# Patient Record
Sex: Female | Born: 1945 | Race: White | Hispanic: No | Marital: Married | Smoking: Never smoker
Health system: Southern US, Community
[De-identification: ages and names within clinical notes are randomized; demographics above are authoritative.]

## PROBLEM LIST (undated history)

## (undated) DIAGNOSIS — M1990 Unspecified osteoarthritis, unspecified site: Secondary | ICD-10-CM

## (undated) DIAGNOSIS — C4492 Squamous cell carcinoma of skin, unspecified: Secondary | ICD-10-CM

## (undated) HISTORY — DX: Unspecified osteoarthritis, unspecified site: M19.90

## (undated) HISTORY — DX: Squamous cell carcinoma of skin, unspecified: C44.92

---

## 2004-07-04 ENCOUNTER — Ambulatory Visit: Payer: Self-pay | Admitting: Internal Medicine

## 2006-07-09 ENCOUNTER — Ambulatory Visit: Payer: Self-pay | Admitting: Internal Medicine

## 2007-10-07 ENCOUNTER — Ambulatory Visit: Payer: Self-pay | Admitting: Internal Medicine

## 2009-10-11 ENCOUNTER — Ambulatory Visit: Payer: Self-pay | Admitting: Internal Medicine

## 2010-11-21 ENCOUNTER — Ambulatory Visit: Payer: Self-pay | Admitting: Internal Medicine

## 2011-12-04 ENCOUNTER — Ambulatory Visit: Payer: Self-pay

## 2012-01-01 ENCOUNTER — Ambulatory Visit: Payer: Self-pay | Admitting: Gastroenterology

## 2012-12-16 ENCOUNTER — Ambulatory Visit: Payer: Self-pay

## 2014-04-20 ENCOUNTER — Ambulatory Visit: Payer: Self-pay | Admitting: Internal Medicine

## 2014-10-01 DIAGNOSIS — R7989 Other specified abnormal findings of blood chemistry: Secondary | ICD-10-CM | POA: Insufficient documentation

## 2015-04-05 DIAGNOSIS — E7800 Pure hypercholesterolemia, unspecified: Secondary | ICD-10-CM | POA: Insufficient documentation

## 2016-10-10 ENCOUNTER — Other Ambulatory Visit: Payer: Self-pay | Admitting: Internal Medicine

## 2016-10-10 DIAGNOSIS — Z1231 Encounter for screening mammogram for malignant neoplasm of breast: Secondary | ICD-10-CM

## 2016-10-24 ENCOUNTER — Ambulatory Visit
Admission: RE | Admit: 2016-10-24 | Discharge: 2016-10-24 | Disposition: A | Discharge status: Home / Self Care | Payer: Medicare Other | Source: Ambulatory Visit | Attending: Internal Medicine | Admitting: Internal Medicine

## 2016-10-24 DIAGNOSIS — Z1231 Encounter for screening mammogram for malignant neoplasm of breast: Secondary | ICD-10-CM | POA: Diagnosis not present

## 2018-10-28 ENCOUNTER — Other Ambulatory Visit: Payer: Self-pay | Admitting: Internal Medicine

## 2018-10-28 DIAGNOSIS — Z1231 Encounter for screening mammogram for malignant neoplasm of breast: Secondary | ICD-10-CM

## 2019-01-01 ENCOUNTER — Other Ambulatory Visit: Payer: Self-pay

## 2019-01-01 ENCOUNTER — Ambulatory Visit
Admission: RE | Admit: 2019-01-01 | Discharge: 2019-01-01 | Disposition: A | Discharge status: Home / Self Care | Payer: Medicare Other | Source: Ambulatory Visit | Attending: Internal Medicine | Admitting: Internal Medicine

## 2019-01-01 DIAGNOSIS — Z1231 Encounter for screening mammogram for malignant neoplasm of breast: Secondary | ICD-10-CM | POA: Insufficient documentation

## 2019-11-10 DIAGNOSIS — Z8616 Personal history of COVID-19: Secondary | ICD-10-CM | POA: Insufficient documentation

## 2019-12-29 ENCOUNTER — Other Ambulatory Visit: Payer: Self-pay

## 2019-12-29 ENCOUNTER — Ambulatory Visit: Payer: Medicare Other | Admitting: Dermatology

## 2019-12-29 DIAGNOSIS — L820 Inflamed seborrheic keratosis: Secondary | ICD-10-CM

## 2019-12-29 DIAGNOSIS — L821 Other seborrheic keratosis: Secondary | ICD-10-CM | POA: Diagnosis not present

## 2019-12-29 DIAGNOSIS — L578 Other skin changes due to chronic exposure to nonionizing radiation: Secondary | ICD-10-CM | POA: Diagnosis not present

## 2019-12-29 NOTE — Progress Notes (Signed)
   New Patient Visit  Subjective  Yvette Baker is a 74 y.o. female who presents for the following: Spot Check (Pt has a spot on her left cheek x approx 1.5 years, irritating, spot gets crusty and hard. ). She has other areas to be evaluated too.  Objective  Well appearing patient in no apparent distress; mood and affect are within normal limits.  A focused examination was performed including face. Relevant physical exam findings are noted in the Assessment and Plan.  Objective  Left cheek: Erythematous keratotic or waxy stuck-on papule or plaque.   Assessment & Plan  Inflamed seborrheic keratosis Left cheek  Follow up in 2 months if needed - If persistent.  Destruction of lesion - Left cheek Complexity: simple   Destruction method: cryotherapy   Informed consent: discussed and consent obtained   Timeout:  patient name, date of birth, surgical site, and procedure verified Lesion destroyed using liquid nitrogen: Yes   Region frozen until ice ball extended beyond lesion: Yes   Outcome: patient tolerated procedure well with no complications   Post-procedure details: wound care instructions given     Seborrheic Keratoses - Stuck-on, waxy, tan-brown papules and plaques  - Discussed benign etiology and prognosis. - Observe - Call for any changes  Actinic Damage - diffuse scaly erythematous macules with underlying dyspigmentation - Recommend daily broad spectrum sunscreen SPF 30+ to sun-exposed areas, reapply every 2 hours as needed.  - Call for new or changing lesions.   Return in about 2 months (around 02/28/2020) for ISK f/u.   IEpifania Gore, CMA, am acting as scribe for Armida Sans, MD.  Documentation: I have reviewed the above documentation for accuracy and completeness, and I agree with the above.  Armida Sans, MD

## 2019-12-29 NOTE — Patient Instructions (Signed)

## 2019-12-30 ENCOUNTER — Encounter: Payer: Self-pay | Admitting: Dermatology

## 2020-03-15 ENCOUNTER — Ambulatory Visit: Payer: Medicare Other | Admitting: Dermatology

## 2020-05-10 ENCOUNTER — Other Ambulatory Visit: Payer: Self-pay | Admitting: Internal Medicine

## 2020-05-10 DIAGNOSIS — Z1231 Encounter for screening mammogram for malignant neoplasm of breast: Secondary | ICD-10-CM

## 2020-05-31 ENCOUNTER — Ambulatory Visit: Payer: Medicare Other | Admitting: Dermatology

## 2020-05-31 ENCOUNTER — Other Ambulatory Visit: Payer: Self-pay

## 2020-05-31 ENCOUNTER — Encounter: Payer: Self-pay | Admitting: Dermatology

## 2020-05-31 DIAGNOSIS — L820 Inflamed seborrheic keratosis: Secondary | ICD-10-CM

## 2020-05-31 DIAGNOSIS — L821 Other seborrheic keratosis: Secondary | ICD-10-CM | POA: Diagnosis not present

## 2020-05-31 DIAGNOSIS — L578 Other skin changes due to chronic exposure to nonionizing radiation: Secondary | ICD-10-CM | POA: Diagnosis not present

## 2020-05-31 NOTE — Patient Instructions (Signed)

## 2020-05-31 NOTE — Progress Notes (Signed)
   Follow-Up Visit   Subjective  Yvette Baker is a 75 y.o. female who presents for the following: irritated seborrheic keratosis (Of the L cheek - persistent, grew back after being tx with LN2).  The following portions of the chart were reviewed this encounter and updated as appropriate:   Allergies  Meds  Problems  Med Hx  Surg Hx  Fam Hx     Review of Systems:  No other skin or systemic complaints except as noted in HPI or Assessment and Plan.  Objective  Well appearing patient in no apparent distress; mood and affect are within normal limits.  A focused examination was performed including the face. Relevant physical exam findings are noted in the Assessment and Plan.  Objective  L cheek x 1: Erythematous keratotic or waxy stuck-on papule or plaque.   Assessment & Plan  Inflamed seborrheic keratosis L cheek x 1  Destruction of lesion - L cheek x 1 Complexity: simple   Destruction method: cryotherapy   Informed consent: discussed and consent obtained   Timeout:  patient name, date of birth, surgical site, and procedure verified Lesion destroyed using liquid nitrogen: Yes   Region frozen until ice ball extended beyond lesion: Yes   Outcome: patient tolerated procedure well with no complications   Post-procedure details: wound care instructions given     Seborrheic Keratoses - Stuck-on, waxy, tan-brown papules and/or plaques  - Benign-appearing - Discussed benign etiology and prognosis. - Observe - Call for any changes  Actinic Damage - chronic, secondary to cumulative UV radiation exposure/sun exposure over time - diffuse scaly erythematous macules with underlying dyspigmentation - Recommend daily broad spectrum sunscreen SPF 30+ to sun-exposed areas, reapply every 2 hours as needed.  - Recommend staying in the shade or wearing long sleeves, sun glasses (UVA+UVB protection) and wide brim hats (4-inch brim around the entire circumference of the hat). - Call for  new or changing lesions.  Return if symptoms worsen or fail to improve.  Maylene Roes, CMA, am acting as scribe for Armida Sans, MD .  Documentation: I have reviewed the above documentation for accuracy and completeness, and I agree with the above.  Armida Sans, MD

## 2021-05-16 ENCOUNTER — Other Ambulatory Visit: Payer: Self-pay | Admitting: Internal Medicine

## 2021-05-16 DIAGNOSIS — Z1231 Encounter for screening mammogram for malignant neoplasm of breast: Secondary | ICD-10-CM

## 2021-06-27 ENCOUNTER — Ambulatory Visit: Payer: Medicare Other | Admitting: Dermatology

## 2021-06-27 DIAGNOSIS — L821 Other seborrheic keratosis: Secondary | ICD-10-CM

## 2021-06-27 DIAGNOSIS — L570 Actinic keratosis: Secondary | ICD-10-CM | POA: Diagnosis not present

## 2021-06-27 DIAGNOSIS — L820 Inflamed seborrheic keratosis: Secondary | ICD-10-CM

## 2021-06-27 DIAGNOSIS — D692 Other nonthrombocytopenic purpura: Secondary | ICD-10-CM

## 2021-06-27 DIAGNOSIS — L578 Other skin changes due to chronic exposure to nonionizing radiation: Secondary | ICD-10-CM | POA: Diagnosis not present

## 2021-06-27 NOTE — Progress Notes (Signed)
? ?Follow-Up Visit ?  ?Subjective  ?Yvette Baker is a 76 y.o. female who presents for the following: Follow-up (The patient has spots, moles and lesions to be evaluated, some may be new or changing and the patient has concerns that these could be cancer. ). ? ?The following portions of the chart were reviewed this encounter and updated as appropriate:  ? Allergies  Meds  Problems  Med Hx  Surg Hx  Fam Hx   ?  ? ?Review of Systems:  No other skin or systemic complaints except as noted in HPI or Assessment and Plan. ? ?Objective  ?Well appearing patient in no apparent distress; mood and affect are within normal limits. ? ?A focused examination was performed including face,arm,chest. Relevant physical exam findings are noted in the Assessment and Plan. ? ?left cheek x 1, chest x 1  (2) (2) ?Stuck-on, waxy, tan-brown papules--Discussed benign etiology and prognosis.  ? ?left brow x 1, nose x 1  (2) (2) ?Erythematous thin papules/macules with gritty scale.  ? ? ?Assessment & Plan  ?Inflamed seborrheic keratosis (2) ?left cheek x 1, chest x 1  (2) ? ?Destruction of lesion - left cheek x 1, chest x 1  (2) ?Complexity: simple   ?Destruction method: cryotherapy   ?Informed consent: discussed and consent obtained   ?Timeout:  patient name, date of birth, surgical site, and procedure verified ?Lesion destroyed using liquid nitrogen: Yes   ?Region frozen until ice ball extended beyond lesion: Yes   ?Outcome: patient tolerated procedure well with no complications   ?Post-procedure details: wound care instructions given   ? ?AK (actinic keratosis) (2) ?left brow x 1, nose x 1  (2) ? ?Actinic keratoses are precancerous spots that appear secondary to cumulative UV radiation exposure/sun exposure over time. They are chronic with expected duration over 1 year. A portion of actinic keratoses will progress to squamous cell carcinoma of the skin. It is not possible to reliably predict which spots will progress to skin cancer  and so treatment is recommended to prevent development of skin cancer. ? ?Recommend daily broad spectrum sunscreen SPF 30+ to sun-exposed areas, reapply every 2 hours as needed.  ?Recommend staying in the shade or wearing long sleeves, sun glasses (UVA+UVB protection) and wide brim hats (4-inch brim around the entire circumference of the hat). ?Call for new or changing lesions.  ? ?Destruction of lesion - left brow x 1, nose x 1  (2) ?Complexity: simple   ?Destruction method: cryotherapy   ?Informed consent: discussed and consent obtained   ?Timeout:  patient name, date of birth, surgical site, and procedure verified ?Lesion destroyed using liquid nitrogen: Yes   ?Region frozen until ice ball extended beyond lesion: Yes   ?Outcome: patient tolerated procedure well with no complications   ?Post-procedure details: wound care instructions given   ? ?Purpura - Chronic; persistent and recurrent.  Treatable, but not curable. ?- Violaceous macules and patches ?- Benign ?- Related to trauma, age, sun damage and/or use of blood thinners, chronic use of topical and/or oral steroids ?- Observe ?- Can use OTC arnica containing moisturizer such as Dermend Bruise Formula if desired ?- Call for worsening or other concerns  ? ?Seborrheic Keratoses ?- Stuck-on, waxy, tan-brown papules and/or plaques  ?- Benign-appearing ?- Discussed benign etiology and prognosis. ?- Observe ?- Call for any changes ? ?Actinic Damage ?- chronic, secondary to cumulative UV radiation exposure/sun exposure over time ?- diffuse scaly erythematous macules with underlying dyspigmentation ?- Recommend daily  broad spectrum sunscreen SPF 30+ to sun-exposed areas, reapply every 2 hours as needed.  ?- Recommend staying in the shade or wearing long sleeves, sun glasses (UVA+UVB protection) and wide brim hats (4-inch brim around the entire circumference of the hat). ?- Call for new or changing lesions.  ? ?Return in about 1 year (around 06/28/2022) for AKs,  ISK. ? ?I, Marye Round, CMA, am acting as scribe for Sarina Ser, MD .  ?Documentation: I have reviewed the above documentation for accuracy and completeness, and I agree with the above. ? ?Sarina Ser, MD ? ?

## 2021-06-27 NOTE — Patient Instructions (Addendum)

## 2021-07-04 ENCOUNTER — Ambulatory Visit
Admission: RE | Admit: 2021-07-04 | Discharge: 2021-07-04 | Disposition: A | Discharge status: Home / Self Care | Payer: Medicare Other | Source: Ambulatory Visit | Attending: Internal Medicine | Admitting: Internal Medicine

## 2021-07-04 DIAGNOSIS — Z1231 Encounter for screening mammogram for malignant neoplasm of breast: Secondary | ICD-10-CM | POA: Diagnosis present

## 2021-07-05 ENCOUNTER — Encounter: Payer: Self-pay | Admitting: Dermatology

## 2022-07-05 ENCOUNTER — Ambulatory Visit: Payer: Medicare Other | Admitting: Dermatology

## 2022-10-18 ENCOUNTER — Ambulatory Visit: Payer: Medicare Other | Admitting: Dermatology

## 2022-10-18 VITALS — BP 137/74 | HR 85

## 2022-10-18 DIAGNOSIS — L570 Actinic keratosis: Secondary | ICD-10-CM

## 2022-10-18 DIAGNOSIS — L578 Other skin changes due to chronic exposure to nonionizing radiation: Secondary | ICD-10-CM

## 2022-10-18 DIAGNOSIS — L820 Inflamed seborrheic keratosis: Secondary | ICD-10-CM

## 2022-10-18 DIAGNOSIS — L821 Other seborrheic keratosis: Secondary | ICD-10-CM | POA: Diagnosis not present

## 2022-10-18 DIAGNOSIS — L814 Other melanin hyperpigmentation: Secondary | ICD-10-CM

## 2022-10-18 DIAGNOSIS — W908XXA Exposure to other nonionizing radiation, initial encounter: Secondary | ICD-10-CM | POA: Diagnosis not present

## 2022-10-18 NOTE — Progress Notes (Signed)
Follow-Up Visit   Subjective  Yvette Baker is a 77 y.o. female who presents for the following: Irregular crusty skin lesion on the L brow x 4-5 mths.  The patient has spots, moles and lesions to be evaluated, some may be new or changing and the patient may have concern these could be cancer.   The following portions of the chart were reviewed this encounter and updated as appropriate: medications, allergies, medical history  Review of Systems:  No other skin or systemic complaints except as noted in HPI or Assessment and Plan.  Objective  Well appearing patient in no apparent distress; mood and affect are within normal limits.   A focused examination was performed of the following areas:   Relevant exam findings are noted in the Assessment and Plan.  L brow x 1, chest x 2 (3) Erythematous thin papules/macules with gritty scale.      R cheek x 1 Erythematous stuck-on, waxy papule or plaque    Assessment & Plan   SEBORRHEIC KERATOSIS - Stuck-on, waxy, tan-brown papules and/or plaques  - Benign-appearing - Discussed benign etiology and prognosis. - Observe - Call for any changes  LENTIGINES Exam: scattered tan macules Due to sun exposure Treatment Plan: Benign-appearing, observe. Recommend daily broad spectrum sunscreen SPF 30+ to sun-exposed areas, reapply every 2 hours as needed.  Call for any changes  ACTINIC DAMAGE - chronic, secondary to cumulative UV radiation exposure/sun exposure over time - diffuse scaly erythematous macules with underlying dyspigmentation - Recommend daily broad spectrum sunscreen SPF 30+ to sun-exposed areas, reapply every 2 hours as needed.  - Recommend staying in the shade or wearing long sleeves, sun glasses (UVA+UVB protection) and wide brim hats (4-inch brim around the entire circumference of the hat). - Call for new or changing lesions.  AK (actinic keratosis) (3) L brow x 1, chest x 2  Actinic keratoses are precancerous  spots that appear secondary to cumulative UV radiation exposure/sun exposure over time. They are chronic with expected duration over 1 year. A portion of actinic keratoses will progress to squamous cell carcinoma of the skin. It is not possible to reliably predict which spots will progress to skin cancer and so treatment is recommended to prevent development of skin cancer.  Recommend daily broad spectrum sunscreen SPF 30+ to sun-exposed areas, reapply every 2 hours as needed.  Recommend staying in the shade or wearing long sleeves, sun glasses (UVA+UVB protection) and wide brim hats (4-inch brim around the entire circumference of the hat). Call for new or changing lesions.   Destruction of lesion - L brow x 1, chest x 2 (3) Complexity: simple   Destruction method: cryotherapy   Informed consent: discussed and consent obtained   Timeout:  patient name, date of birth, surgical site, and procedure verified Lesion destroyed using liquid nitrogen: Yes   Region frozen until ice ball extended beyond lesion: Yes   Outcome: patient tolerated procedure well with no complications   Post-procedure details: wound care instructions given    Inflamed seborrheic keratosis R cheek x 1  Symptomatic, irritating, patient would like treated.   Destruction of lesion - R cheek x 1 Complexity: simple   Destruction method: cryotherapy   Informed consent: discussed and consent obtained   Timeout:  patient name, date of birth, surgical site, and procedure verified Lesion destroyed using liquid nitrogen: Yes   Region frozen until ice ball extended beyond lesion: Yes   Outcome: patient tolerated procedure well with no complications  Post-procedure details: wound care instructions given     Return for AK follow up in 3-5 mths.  Maylene Roes, CMA, am acting as scribe for Armida Sans, MD .   Documentation: I have reviewed the above documentation for accuracy and completeness, and I agree with the  above.  Armida Sans, MD

## 2022-10-18 NOTE — Patient Instructions (Addendum)

## 2022-10-26 ENCOUNTER — Encounter: Payer: Self-pay | Admitting: Dermatology

## 2022-11-27 ENCOUNTER — Other Ambulatory Visit: Payer: Self-pay | Admitting: Internal Medicine

## 2022-11-27 DIAGNOSIS — Z1231 Encounter for screening mammogram for malignant neoplasm of breast: Secondary | ICD-10-CM

## 2022-12-05 LAB — HM DEXA SCAN

## 2023-01-09 ENCOUNTER — Encounter: Payer: Self-pay | Admitting: Family Medicine

## 2023-01-09 ENCOUNTER — Ambulatory Visit (INDEPENDENT_AMBULATORY_CARE_PROVIDER_SITE_OTHER): Payer: Medicare Other | Admitting: Family Medicine

## 2023-01-09 VITALS — BP 134/68 | HR 82 | Temp 98.0°F | Resp 16 | Ht 60.0 in | Wt 145.1 lb

## 2023-01-09 DIAGNOSIS — I10 Essential (primary) hypertension: Secondary | ICD-10-CM

## 2023-01-09 DIAGNOSIS — E559 Vitamin D deficiency, unspecified: Secondary | ICD-10-CM | POA: Diagnosis not present

## 2023-01-09 DIAGNOSIS — K219 Gastro-esophageal reflux disease without esophagitis: Secondary | ICD-10-CM

## 2023-01-09 DIAGNOSIS — M810 Age-related osteoporosis without current pathological fracture: Secondary | ICD-10-CM | POA: Diagnosis not present

## 2023-01-09 DIAGNOSIS — E785 Hyperlipidemia, unspecified: Secondary | ICD-10-CM

## 2023-01-09 DIAGNOSIS — Z23 Encounter for immunization: Secondary | ICD-10-CM | POA: Diagnosis not present

## 2023-01-09 DIAGNOSIS — J309 Allergic rhinitis, unspecified: Secondary | ICD-10-CM

## 2023-01-09 DIAGNOSIS — Z1231 Encounter for screening mammogram for malignant neoplasm of breast: Secondary | ICD-10-CM

## 2023-01-09 NOTE — Progress Notes (Signed)
SUBJECTIVE:   Chief Complaint  Patient presents with   Establish Care   HPI Presents to clinic to establish care  Discussed the use of AI scribe software for clinical note transcription with the patient, who gave verbal consent to proceed.  History of Present Illness Yvette Baker, a 77 year old patient with a history of vitamin D deficiency, high cholesterol, and bone density issues, presents for establishing care. She was previously under the care of [facility]. Her most recent blood work was done in September of the current year, which included checks for vitamin D levels and cholesterol. She reports that her bad cholesterol levels are good, attributing this to an active lifestyle and work routine.  She has been on vitamin D supplements, initially once a week, now every two weeks, as per her previous doctor's instructions. She has also recently started on Fosamax (70mg  once a week) following a bone density test. She reports having taken a month's worth of Fosamax already.  She was previously on Zetia for cholesterol management but stopped taking it about six to eight months ago due to leg pain that would wake her up at night. She also uses Flonase occasionally for nasal issues and takes hydrochlorothiazide (12.5mg ) for blood pressure management. She takes loratadine daily for allergies, which she reports has helped prevent sinus infections. She has been on omeprazole for a long time for stomach issues.  She reports no issues with hearing, and no problems with bowel movements or urination. She denies any chest pain, shortness of breath, or leg swelling. She has had normal mammograms in the past, with the most recent one being last year. She has also had a normal colonoscopy and does not require another one. Her last Pap smear was when she was 41 or younger, and it was normal. She has received the first shingles shot, the flu shot, and the pneumonia vaccine. She has not had a tetanus  shot.  She reports having had walking pneumonia during the last part of the COVID-19 pandemic. She also mentions an episode of dizziness that led to a fall and subsequent bleeding from the head. She was taken to the hospital and everything was found to be normal. She has been living in the [place] area since the 1970s and leads an active lifestyle.    PERTINENT PMH / PSH: As above  OBJECTIVE:  BP 134/68   Pulse 82   Temp 98 F (36.7 C)   Resp 16   Ht 5' (1.524 m)   Wt 145 lb 2 oz (65.8 kg)   SpO2 97%   BMI 28.34 kg/m    Physical Exam Vitals reviewed.  Constitutional:      General: She is not in acute distress.    Appearance: She is not ill-appearing.  HENT:     Head: Normocephalic.     Right Ear: Tympanic membrane, ear canal and external ear normal.     Left Ear: Tympanic membrane, ear canal and external ear normal.     Nose: Nose normal.     Mouth/Throat:     Mouth: Mucous membranes are moist.  Eyes:     Extraocular Movements: Extraocular movements intact.     Conjunctiva/sclera: Conjunctivae normal.     Pupils: Pupils are equal, round, and reactive to light.  Neck:     Thyroid: No thyromegaly or thyroid tenderness.     Vascular: No carotid bruit.  Cardiovascular:     Rate and Rhythm: Normal rate and regular rhythm.  Pulses: Normal pulses.     Heart sounds: Normal heart sounds.  Pulmonary:     Effort: Pulmonary effort is normal.     Breath sounds: Normal breath sounds.  Abdominal:     General: Bowel sounds are normal. There is no distension.     Palpations: Abdomen is soft.     Tenderness: There is no abdominal tenderness. There is no right CVA tenderness, left CVA tenderness, guarding or rebound.  Musculoskeletal:        General: Normal range of motion.     Cervical back: Normal range of motion.     Right lower leg: No edema.     Left lower leg: No edema.  Lymphadenopathy:     Cervical: No cervical adenopathy.  Skin:    Capillary Refill: Capillary  refill takes less than 2 seconds.  Neurological:     General: No focal deficit present.     Mental Status: She is alert and oriented to person, place, and time. Mental status is at baseline.     Motor: No weakness.  Psychiatric:        Mood and Affect: Mood normal.        Behavior: Behavior normal.        Thought Content: Thought content normal.        Judgment: Judgment normal.        01/09/2023   10:38 AM  Depression screen PHQ 2/9  Decreased Interest 0  Down, Depressed, Hopeless 0  PHQ - 2 Score 0  Altered sleeping 0  Tired, decreased energy 0  Change in appetite 0  Feeling bad or failure about yourself  0  Trouble concentrating 0  Moving slowly or fidgety/restless 0  Suicidal thoughts 0  PHQ-9 Score 0  Difficult doing work/chores Not difficult at all      01/09/2023   10:38 AM  GAD 7 : Generalized Anxiety Score  Nervous, Anxious, on Edge 0  Control/stop worrying 0  Worry too much - different things 0  Trouble relaxing 0  Restless 0  Easily annoyed or irritable 0  Afraid - awful might happen 0  Total GAD 7 Score 0  Anxiety Difficulty Not difficult at all    ASSESSMENT/PLAN:  Primary hypertension Assessment & Plan: Controlled on hydrochlorothiazide 12.5mg  daily. -Continue hydrochlorothiazide 12.5mg  daily.   Need for influenza vaccination -     Flu Vaccine Trivalent High Dose (Fluad)  Osteoporosis, unspecified osteoporosis type, unspecified pathological fracture presence Assessment & Plan: Recently started on Fosamax 70mg  weekly. No reported side effects. -Continue Fosamax 70mg  weekly.   Vitamin D deficiency Assessment & Plan: On Vitamin D supplementation, frequency recently reduced to every two weeks. -Continue Vitamin D supplementation as prescribed.   Hyperlipidemia, unspecified hyperlipidemia type Assessment & Plan: Self discontinued Zetia due to muscle pain approximately 6-8 months ago. Reports good lifestyle habits including physical  activity. -No current plan for medication. Encourage continuation of healthy lifestyle habits. -Review recent labs from former PCP   Allergic rhinitis, unspecified seasonality, unspecified trigger Assessment & Plan: Reports daily use of loratadine and occasional use of Flonase. -Continue loratadine and Flonase as needed.   Gastroesophageal reflux disease without esophagitis Assessment & Plan: Long-term use of omeprazole. -Continue omeprazole daily.   Breast cancer screening by mammogram -     3D Screening Mammogram, Left and Right; Future    General Health Maintenance / Followup Plans -Review recent labs from September 2024. -Follow up in 6 months for HTN, HLD -Dexa  completed.  HIP L.FEM. NECK 12/05/22 0.563 -2.6    PDMP reviewed  Return if symptoms worsen or fail to improve, for PCP.  Dana Allan, MD

## 2023-01-09 NOTE — Patient Instructions (Addendum)
It was a pleasure meeting you today. Thank you for allowing me to take part in your health care.  Our goals for today as we discussed include:  Will get labs at next visit  Will review records    This is a list of the screening recommended for you and due dates:  Health Maintenance  Topic Date Due   Medicare Annual Wellness Visit  Never done   COVID-19 Vaccine (1) Never done   Hepatitis C Screening  Never done   DTaP/Tdap/Td vaccine (1 - Tdap) Never done   Zoster (Shingles) Vaccine (1 of 2) 01/07/1965   DEXA scan (bone density measurement)  Never done   Pneumonia Vaccine  Completed   Flu Shot  Completed   HPV Vaccine  Aged Out    Follow up as needed  If you have any questions or concerns, please do not hesitate to call the office at [phone].  I look forward to our next visit and until then take care and stay safe.  Regards,   Dana Allan, MD   [facility]

## 2023-01-15 ENCOUNTER — Encounter: Payer: Self-pay | Admitting: Family Medicine

## 2023-01-15 ENCOUNTER — Telehealth: Payer: Self-pay | Admitting: Family Medicine

## 2023-01-15 ENCOUNTER — Telehealth: Payer: Self-pay

## 2023-01-15 DIAGNOSIS — I10 Essential (primary) hypertension: Secondary | ICD-10-CM | POA: Insufficient documentation

## 2023-01-15 DIAGNOSIS — J309 Allergic rhinitis, unspecified: Secondary | ICD-10-CM | POA: Insufficient documentation

## 2023-01-15 DIAGNOSIS — Z1231 Encounter for screening mammogram for malignant neoplasm of breast: Secondary | ICD-10-CM | POA: Insufficient documentation

## 2023-01-15 DIAGNOSIS — M810 Age-related osteoporosis without current pathological fracture: Secondary | ICD-10-CM | POA: Insufficient documentation

## 2023-01-15 DIAGNOSIS — E559 Vitamin D deficiency, unspecified: Secondary | ICD-10-CM | POA: Insufficient documentation

## 2023-01-15 DIAGNOSIS — K219 Gastro-esophageal reflux disease without esophagitis: Secondary | ICD-10-CM | POA: Insufficient documentation

## 2023-01-15 DIAGNOSIS — E785 Hyperlipidemia, unspecified: Secondary | ICD-10-CM | POA: Insufficient documentation

## 2023-01-15 DIAGNOSIS — Z23 Encounter for immunization: Secondary | ICD-10-CM | POA: Insufficient documentation

## 2023-01-15 NOTE — Assessment & Plan Note (Signed)
Self discontinued Zetia due to muscle pain approximately 6-8 months ago. Reports good lifestyle habits including physical activity. -No current plan for medication. Encourage continuation of healthy lifestyle habits. -Review recent labs from former PCP

## 2023-01-15 NOTE — Assessment & Plan Note (Signed)
On Vitamin D supplementation, frequency recently reduced to every two weeks. -Continue Vitamin D supplementation as prescribed.

## 2023-01-15 NOTE — Telephone Encounter (Signed)
Error

## 2023-01-15 NOTE — Assessment & Plan Note (Signed)
Reports daily use of loratadine and occasional use of Flonase. -Continue loratadine and Flonase as needed.

## 2023-01-15 NOTE — Telephone Encounter (Signed)
er

## 2023-01-15 NOTE — Assessment & Plan Note (Signed)
Long-term use of omeprazole. -Continue omeprazole daily.

## 2023-01-15 NOTE — Assessment & Plan Note (Signed)
Controlled on hydrochlorothiazide 12.5mg  daily. -Continue hydrochlorothiazide 12.5mg  daily.

## 2023-01-15 NOTE — Assessment & Plan Note (Signed)
Recently started on Fosamax 70mg  weekly. No reported side effects. -Continue Fosamax 70mg  weekly.

## 2023-03-05 ENCOUNTER — Encounter: Payer: Self-pay | Admitting: Dermatology

## 2023-03-05 ENCOUNTER — Ambulatory Visit: Payer: Medicare Other | Admitting: Dermatology

## 2023-03-05 DIAGNOSIS — D492 Neoplasm of unspecified behavior of bone, soft tissue, and skin: Secondary | ICD-10-CM | POA: Diagnosis not present

## 2023-03-05 DIAGNOSIS — L820 Inflamed seborrheic keratosis: Secondary | ICD-10-CM | POA: Diagnosis not present

## 2023-03-05 DIAGNOSIS — W908XXA Exposure to other nonionizing radiation, initial encounter: Secondary | ICD-10-CM | POA: Diagnosis not present

## 2023-03-05 DIAGNOSIS — D099 Carcinoma in situ, unspecified: Secondary | ICD-10-CM

## 2023-03-05 DIAGNOSIS — D485 Neoplasm of uncertain behavior of skin: Secondary | ICD-10-CM

## 2023-03-05 DIAGNOSIS — L578 Other skin changes due to chronic exposure to nonionizing radiation: Secondary | ICD-10-CM | POA: Diagnosis not present

## 2023-03-05 DIAGNOSIS — C44320 Squamous cell carcinoma of skin of unspecified parts of face: Secondary | ICD-10-CM | POA: Diagnosis not present

## 2023-03-05 DIAGNOSIS — C44329 Squamous cell carcinoma of skin of other parts of face: Secondary | ICD-10-CM

## 2023-03-05 DIAGNOSIS — L570 Actinic keratosis: Secondary | ICD-10-CM | POA: Diagnosis not present

## 2023-03-05 HISTORY — DX: Carcinoma in situ, unspecified: D09.9

## 2023-03-05 NOTE — Progress Notes (Signed)
 Follow-Up Visit   Subjective  Yvette Baker is a 78 y.o. female who presents for the following: 4 month AK follow up. Patient advises AK treated at left brow came back, spots at chest that were treated resolved.   No hx skin cancer.   The patient has spots, moles and lesions to be evaluated, some may be new or changing and the patient may have concern these could be cancer.   The following portions of the chart were reviewed this encounter and updated as appropriate: medications, allergies, medical history  Review of Systems:  No other skin or systemic complaints except as noted in HPI or Assessment and Plan.  Objective  Well appearing patient in no apparent distress; mood and affect are within normal limits.   A focused examination was performed of the following areas: Face, chest  Relevant exam findings are noted in the Assessment and Plan.  mid chest x 1 Erythematous thin papules/macules with gritty scale.  above left brow 0.7 cm hyperkeratotic papule R/o SCC   right nose supratip x 1 Erythematous stuck-on, waxy papule or plaque  Assessment & Plan   ACTINIC DAMAGE - chronic, secondary to cumulative UV radiation exposure/sun exposure over time - diffuse scaly erythematous macules with underlying dyspigmentation - Recommend daily broad spectrum sunscreen SPF 30+ to sun-exposed areas, reapply every 2 hours as needed.  - Recommend staying in the shade or wearing long sleeves, sun glasses (UVA+UVB protection) and wide brim hats (4-inch brim around the entire circumference of the hat). - Call for new or changing lesions.   AK (ACTINIC KERATOSIS) mid chest x 1 Actinic keratoses are precancerous spots that appear secondary to cumulative UV radiation exposure/sun exposure over time. They are chronic with expected duration over 1 year. A portion of actinic keratoses will progress to squamous cell carcinoma of the skin. It is not possible to reliably predict which spots  will progress to skin cancer and so treatment is recommended to prevent development of skin cancer.  Recommend daily broad spectrum sunscreen SPF 30+ to sun-exposed areas, reapply every 2 hours as needed.  Recommend staying in the shade or wearing long sleeves, sun glasses (UVA+UVB protection) and wide brim hats (4-inch brim around the entire circumference of the hat). Call for new or changing lesions.  Destruction of lesion - mid chest x 1  Destruction method: cryotherapy   Informed consent: discussed and consent obtained   Lesion destroyed using liquid nitrogen: Yes   Cryotherapy cycles:  2 Outcome: patient tolerated procedure well with no complications   Post-procedure details: wound care instructions given   NEOPLASM OF UNCERTAIN BEHAVIOR OF SKIN above left brow Epidermal / dermal shaving  Lesion diameter (cm):  0.7 Informed consent: discussed and consent obtained   Timeout: patient name, date of birth, surgical site, and procedure verified   Anesthesia: the lesion was anesthetized in a standard fashion   Anesthetic:  1% lidocaine w/ epinephrine 1-100,000 local infiltration Instrument used: flexible razor blade   Hemostasis achieved with: aluminum chloride   Outcome: patient tolerated procedure well   Post-procedure details: wound care instructions given   Additional details:  Mupirocin and a bandage applied  Destruction of lesion  Destruction method: electrodesiccation and curettage   Informed consent: discussed and consent obtained   Timeout:  patient name, date of birth, surgical site, and procedure verified Anesthesia: the lesion was anesthetized in a standard fashion   Anesthetic:  1% lidocaine w/ epinephrine 1-100,000 buffered w/ 8.4% NaHCO3 Curettage performed in  three different directions: Yes   Electrodesiccation performed over the curetted area: Yes   Curettage cycles:  3 Lesion length (cm):  0.7 Lesion width (cm):  0.7 Margin per side (cm):  0.2 Final wound  size (cm):  1.1 Hemostasis achieved with:  electrodesiccation Outcome: patient tolerated procedure well with no complications   Post-procedure details: sterile dressing applied and wound care instructions given   Dressing type: petrolatum   Specimen 1 - Surgical pathology Differential Diagnosis: R/o SCC  Check Margins: No 0.7 cm hyperkeratotic papule Treated with EDC INFLAMED SEBORRHEIC KERATOSIS right nose supratip x 1 Destruction of lesion - right nose supratip x 1  Destruction method: cryotherapy   Informed consent: discussed and consent obtained   Lesion destroyed using liquid nitrogen: Yes   Cryotherapy cycles:  2 Outcome: patient tolerated procedure well with no complications   Post-procedure details: wound care instructions given   ACTINIC SKIN DAMAGE    Return in about 6 months (around 09/02/2023) for ISK follow up, AK follow up, with Dr. LOIS LILLETTE Lonell Lorren, RMA, am acting as scribe for Alm Rhyme, MD .   Documentation: I have reviewed the above documentation for accuracy and completeness, and I agree with the above.  Alm Rhyme, MD

## 2023-03-05 NOTE — Patient Instructions (Addendum)
 Cryotherapy Aftercare  Wash gently with soap and water everyday.   Apply Vaseline and Band-Aid daily until healed.   Wound Care Instructions  Cleanse wound gently with soap and water once a day then pat dry with clean gauze. Apply a thin coat of Petrolatum (petroleum jelly, Vaseline) over the wound (unless you have an allergy to this). We recommend that you use a new, sterile tube of Vaseline. Do not pick or remove scabs. Do not remove the yellow or white healing tissue from the base of the wound.  Cover the wound with fresh, clean, nonstick gauze and secure with paper tape. You may use Band-Aids in place of gauze and tape if the wound is small enough, but would recommend trimming much of the tape off as there is often too much. Sometimes Band-Aids can irritate the skin.  You should call the office for your biopsy report after 1 week if you have not already been contacted.  If you experience any problems, such as abnormal amounts of bleeding, swelling, significant bruising, significant pain, or evidence of infection, please call the office immediately.  FOR ADULT SURGERY PATIENTS: If you need something for pain relief you may take 1 extra strength Tylenol  (acetaminophen ) AND 2 Ibuprofen (200mg  each) together every 4 hours as needed for pain. (do not take these if you are allergic to them or if you have a reason you should not take them.) Typically, you may only need pain medication for 1 to 3 days.     Due to recent changes in healthcare laws, you may see results of your pathology and/or laboratory studies on MyChart before the doctors have had a chance to review them. We understand that in some cases there may be results that are confusing or concerning to you. Please understand that not all results are received at the same time and often the doctors may need to interpret multiple results in order to provide you with the best plan of care or course of treatment. Therefore, we ask that you  please give us  2 business days to thoroughly review all your results before contacting the office for clarification. Should we see a critical lab result, you will be contacted sooner.   If You Need Anything After Your Visit  If you have any questions or concerns for your doctor, please call our main line at [phone] and press option 4 to reach your doctor's medical assistant. If no one answers, please leave a voicemail as directed and we will return your call as soon as possible. Messages left after 4 pm will be answered the following business day.   You may also send us  a message via MyChart. We typically respond to MyChart messages within 1-2 business days.  For prescription refills, please ask your pharmacy to contact our office. Our fax number is [phone].  If you have an urgent issue when the clinic is closed that cannot wait until the next business day, you can page your doctor at the number below.    Please note that while we do our best to be available for urgent issues outside of office hours, we are not available 24/7.   If you have an urgent issue and are unable to reach us , you may choose to seek medical care at your doctor's office, retail clinic, urgent care center, or emergency room.  If you have a medical emergency, please immediately call 911 or go to the emergency department.  Pager Numbers  - Dr. Hester: [phone]  -  Dr. Jackquline: [phone]  - Dr. Claudene: [phone]   In the event of inclement weather, please call our main line at [phone] for an update on the status of any delays or closures.  Dermatology Medication Tips: Please keep the boxes that topical medications come in in order to help keep track of the instructions about where and how to use these. Pharmacies typically print the medication instructions only on the boxes and not directly on the medication tubes.   If your medication is too expensive, please contact our office at  [phone] option 4 or send us  a message through MyChart.   We are unable to tell what your co-pay for medications will be in advance as this is different depending on your insurance coverage. However, we may be able to find a substitute medication at lower cost or fill out paperwork to get insurance to cover a needed medication.   If a prior authorization is required to get your medication covered by your insurance company, please allow us  1-2 business days to complete this process.  Drug prices often vary depending on where the prescription is filled and some pharmacies may offer cheaper prices.  The website www.goodrx.com contains coupons for medications through different pharmacies. The prices here do not account for what the cost may be with help from insurance (it may be cheaper with your insurance), but the website can give you the price if you did not use any insurance.  - You can print the associated coupon and take it with your prescription to the pharmacy.  - You may also stop by our office during regular business hours and pick up a GoodRx coupon card.  - If you need your prescription sent electronically to a different pharmacy, notify our office through [facility] or by phone at [phone] option 4.     Si Usted Necesita Algo Despus de Su Visita  Tambin puede enviarnos un mensaje a travs de Clinical cytogeneticist. Por lo general respondemos a los mensajes de MyChart en el transcurso de 1 a 2 das hbiles.  Para renovar recetas, por favor pida a su farmacia que se ponga en contacto con nuestra oficina. Randi lakes de fax es [place] [phone].  Si tiene un asunto urgente cuando la clnica est cerrada y que no puede esperar hasta el siguiente da hbil, puede llamar/localizar a su doctor(a) al nmero que aparece a continuacin.   Por favor, tenga en cuenta que aunque hacemos todo lo posible para estar disponibles para asuntos urgentes fuera del horario de [place], no estamos  disponibles las 24 horas del da, los 7 [address]  [address] de la [place].   Si tiene un problema urgente y no puede comunicarse con nosotros, puede optar por buscar atencin mdica  en el consultorio de su doctor(a), en una clnica privada, en un centro de atencin urgente o en una sala de emergencias.  Si tiene Engineer, drilling, por favor llame inmediatamente al 911 o vaya a la sala de emergencias.  Nmeros de bper  - Dr. Hester: [phone]  - Dra. Jackquline: [phone]  - Dr. Claudene: [phone]   En caso de inclemencias del tiempo, por favor llame a landry capes principal al [phone] para una actualizacin sobre el [place] de cualquier retraso o cierre.  Consejos para la medicacin en dermatologa: Por favor, guarde las cajas en las que vienen los medicamentos de uso tpico para ayudarle a seguir las instrucciones sobre dnde y cmo usarlos. Las farmacias generalmente imprimen las instrucciones del medicamento slo en las  cajas y no directamente en los tubos del medicamento.   Si su medicamento es muy caro, por favor, pngase en contacto con landry rieger llamando al [phone] y presione la opcin 4 o envenos un mensaje a travs de Clinical cytogeneticist.   No podemos decirle cul ser su copago por los medicamentos por adelantado ya que esto es diferente dependiendo de la cobertura de su seguro. Sin embargo, es posible que podamos encontrar un medicamento sustituto a Audiological scientist un formulario para que el seguro cubra el medicamento que se considera necesario.   Si se requiere una autorizacin previa para que su compaa de seguros malta su medicamento, por favor permtanos de 1 a 2 das hbiles para completar este proceso.  Los precios de los medicamentos varan con frecuencia dependiendo del Environmental consultant de dnde se surte la receta y alguna farmacias pueden ofrecer precios ms baratos.  El sitio web www.goodrx.com tiene cupones para medicamentos de Health and safety inspector. Los precios aqu no  tienen en cuenta lo que podra costar con la ayuda del seguro (puede ser ms barato con su seguro), pero el sitio web puede darle el precio si no utiliz Tourist information centre manager.  - Puede imprimir el cupn correspondiente y llevarlo con su receta a la farmacia.  - Tambin puede pasar por nuestra oficina durante el horario de atencin regular y Education officer, museum una tarjeta de cupones de GoodRx.  - Si necesita que su receta se enve electrnicamente a una farmacia diferente, informe a nuestra oficina a travs de MyChart de [place] o por telfono llamando al [phone] y presione la opcin 4.

## 2023-03-06 LAB — SURGICAL PATHOLOGY

## 2023-03-07 ENCOUNTER — Telehealth: Payer: Self-pay

## 2023-03-07 NOTE — Telephone Encounter (Addendum)
 Tried calling patient regarding bx results. No answer. Could not leave message. Will try again at later time.    ----- Message from Alm Rhyme sent at 03/07/2023  6:45 AM EST ----- FINAL DIAGNOSIS        1. 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

## 2023-03-08 ENCOUNTER — Telehealth: Payer: Self-pay | Admitting: Family Medicine

## 2023-03-08 ENCOUNTER — Telehealth: Payer: Self-pay

## 2023-03-08 NOTE — Telephone Encounter (Signed)
 Opened in error; Disregard.

## 2023-03-08 NOTE — Telephone Encounter (Addendum)
 Tried calling patient regarding results. No answer. Lm for patient to return call.  ----- Message from Alm Rhyme sent at 03/07/2023  6:45 AM EST ----- FINAL DIAGNOSIS        1. 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

## 2023-03-08 NOTE — Telephone Encounter (Signed)
 Copied from CRM [id number]. Topic: Medicare AWV >> Mar 08, 2023 11:04 AM Nathanel DEL wrote: Reason for CRM: Called 03/08/2023 to sched AWV - NO VOICEMAIL  Nathanel Paschal; Care Guide Ambulatory Clinical Support [place] [facility] Direct Dial: [phone]

## 2023-03-12 ENCOUNTER — Telehealth: Payer: Self-pay

## 2023-03-12 NOTE — Telephone Encounter (Signed)
Discussed pathology results with patient. 

## 2023-03-12 NOTE — Telephone Encounter (Signed)
-----   Message from Armida Sans sent at 03/07/2023  6:45 AM EST ----- FINAL DIAGNOSIS        1. 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

## 2023-03-13 ENCOUNTER — Ambulatory Visit: Payer: Medicare Other | Admitting: Dermatology

## 2023-04-11 ENCOUNTER — Other Ambulatory Visit: Payer: Self-pay | Admitting: Family Medicine

## 2023-04-11 MED ORDER — OMEPRAZOLE 20 MG PO CPDR
20.0000 mg | DELAYED_RELEASE_CAPSULE | Freq: Every day | ORAL | 3 refills | Status: DC
Start: 2023-04-11 — End: 2023-07-16

## 2023-04-11 MED ORDER — HYDROCHLOROTHIAZIDE 12.5 MG PO TABS: 12.5000 mg | ORAL_TABLET | Freq: Every day | ORAL | 3 refills | Status: DC

## 2023-04-11 NOTE — Telephone Encounter (Signed)
Copied from CRM [id number]. Topic: Clinical - Medication Refill >> Apr 11, 2023  8:51 AM Dimitri Ped wrote: Most Recent Primary Care Visit:  Provider: Dana Allan  Department: [facility]  Visit Type: NEW PATIENT  Date: 01/09/2023  Medication: omeprazole (PRILOSEC) 20 MG capsule hydrochlorothiazide (HYDRODIURIL) 12.5 MG tablet Vitamin D, Ergocalciferol, (DRISDOL) 1.25 MG (50000 UNIT) CAPS capsule   Has the patient contacted their pharmacy? No no refills (Agent: If no, request that the patient contact the pharmacy for the refill. If patient does not wish to contact the pharmacy document the reason why and proceed with request.) (Agent: If yes, when and what did the pharmacy advise?)  Is this the correct pharmacy for this prescription? Yes If no, delete pharmacy and type the correct one.  This is the patient's preferred pharmacy:  [facility] - [place], [place] - [address] [place] [address] Phone: [phone] Fax: [phone]   Has the prescription been filled recently? No  Is the patient out of the medication? No a month supply left dont see doctor till may and will be out   Has the patient been seen for an appointment in the last year OR does the patient have an upcoming appointment? Yes  Can we respond through MyChart? No  Agent: Please be advised that Rx refills may take up to 3 business days. We ask that you follow-up with your pharmacy.

## 2023-07-16 ENCOUNTER — Encounter: Payer: Self-pay | Admitting: Family Medicine

## 2023-07-16 ENCOUNTER — Ambulatory Visit (INDEPENDENT_AMBULATORY_CARE_PROVIDER_SITE_OTHER): Payer: Medicare Other | Admitting: Family Medicine

## 2023-07-16 VITALS — BP 124/64 | HR 80 | Temp 97.8°F | Resp 20 | Ht 60.0 in | Wt 144.1 lb

## 2023-07-16 DIAGNOSIS — I10 Essential (primary) hypertension: Secondary | ICD-10-CM | POA: Diagnosis not present

## 2023-07-16 DIAGNOSIS — E559 Vitamin D deficiency, unspecified: Secondary | ICD-10-CM | POA: Diagnosis not present

## 2023-07-16 DIAGNOSIS — E785 Hyperlipidemia, unspecified: Secondary | ICD-10-CM

## 2023-07-16 DIAGNOSIS — K219 Gastro-esophageal reflux disease without esophagitis: Secondary | ICD-10-CM | POA: Diagnosis not present

## 2023-07-16 DIAGNOSIS — T7840XA Allergy, unspecified, initial encounter: Secondary | ICD-10-CM | POA: Insufficient documentation

## 2023-07-16 DIAGNOSIS — R7309 Other abnormal glucose: Secondary | ICD-10-CM

## 2023-07-16 DIAGNOSIS — M1990 Unspecified osteoarthritis, unspecified site: Secondary | ICD-10-CM | POA: Insufficient documentation

## 2023-07-16 DIAGNOSIS — E538 Deficiency of other specified B group vitamins: Secondary | ICD-10-CM | POA: Diagnosis not present

## 2023-07-16 DIAGNOSIS — J309 Allergic rhinitis, unspecified: Secondary | ICD-10-CM

## 2023-07-16 LAB — LIPID PANEL
Cholesterol: 233 mg/dL — ABNORMAL HIGH (ref 0–200)
HDL: 69 mg/dL (ref >39.00)
LDL Cholesterol: 138 mg/dL — ABNORMAL HIGH (ref 0–99)
NonHDL: 163.56
Total CHOL/HDL Ratio: 3
Triglycerides: 129 mg/dL (ref 0.0–149.0)
VLDL: 25.8 mg/dL (ref 0.0–40.0)

## 2023-07-16 LAB — COMPREHENSIVE METABOLIC PANEL WITH GFR
ALT: 16 U/L (ref 0–35)
AST: 22 U/L (ref 0–37)
Albumin: 4.4 g/dL (ref 3.5–5.2)
Alkaline Phosphatase: 55 U/L (ref 39–117)
BUN: 17 mg/dL (ref 6–23)
CO2: 31 meq/L (ref 19–32)
Calcium: 9.5 mg/dL (ref 8.4–10.5)
Chloride: 101 meq/L (ref 96–112)
Creatinine, Ser: 0.9 mg/dL (ref 0.40–1.20)
GFR: 61.23 mL/min (ref >60.00)
Glucose, Bld: 93 mg/dL (ref 70–99)
Potassium: 3.5 meq/L (ref 3.5–5.1)
Sodium: 140 meq/L (ref 135–145)
Total Bilirubin: 0.6 mg/dL (ref 0.2–1.2)
Total Protein: 7.2 g/dL (ref 6.0–8.3)

## 2023-07-16 LAB — HEMOGLOBIN A1C: Hgb A1c MFr Bld: 6 % (ref 4.6–6.5)

## 2023-07-16 MED ORDER — HYDROCHLOROTHIAZIDE 12.5 MG PO TABS
12.5000 mg | ORAL_TABLET | Freq: Every day | ORAL | 3 refills | Status: DC
Start: 2023-07-16 — End: 2023-11-05

## 2023-07-16 MED ORDER — OMEPRAZOLE 20 MG PO CPDR
20.0000 mg | DELAYED_RELEASE_CAPSULE | Freq: Every day | ORAL | 3 refills | Status: DC
Start: 2023-07-16 — End: 2023-11-05

## 2023-07-16 NOTE — Progress Notes (Unsigned)
 SUBJECTIVE:   Chief Complaint  Patient presents with   Hypertension    6 month follow up   HPI Presents for follow up chronic disease management  Discussed the use of AI scribe software for clinical note transcription with the patient, who gave verbal consent to proceed.  History of Present Illness Yvette Baker is a 78 year old female who presents for a routine checkup and medication refills.  Her blood pressure is well-controlled on a low dose of hydrochlorothiazide . She experiences occasional episodes of lightheadedness, which she attributes to possible low blood sugar, but these are infrequent. She monitors her blood pressure at home using a machine. No chest pain, shortness of breath, or swelling in her legs.  She has a history of acid reflux and continues to take Prilosec. In the past, she experienced severe acid reflux associated with allergies and watery eyes. She uses moisture drops for her eyes. No belly pain, constipation, or diarrhea. She reports eating a lot of fruits and vegetables.  She is planning to undergo cataract surgery early next year with Dr. Joelle Musca in [place]. She has noticed increased tearing and changes in her vision, particularly in her left eye. Her vision was recently checked by Dr. Lennart Quitter office, confirming the need for cataract surgery. Her husband's significant vision issues, being nearly blind in his right eye, have made her cautious about her own eye surgery.  She is retired and lives with her husband. She is active, often climbing hills around her home.   PERTINENT PMH / PSH: As above  OBJECTIVE:  BP 124/64   Pulse 80   Temp 97.8 F (36.6 C)   Resp 20   Ht 5' (1.524 m)   Wt 144 lb 2 oz (65.4 kg)   SpO2 98%   BMI 28.15 kg/m    Physical Exam Vitals reviewed.  Constitutional:      General: She is not in acute distress.    Appearance: Normal appearance. She is normal weight. She is not ill-appearing, toxic-appearing or  diaphoretic.  Eyes:     General:        Right eye: No discharge.        Left eye: No discharge.     Conjunctiva/sclera: Conjunctivae normal.  Cardiovascular:     Rate and Rhythm: Normal rate and regular rhythm.     Heart sounds: Normal heart sounds.  Pulmonary:     Effort: Pulmonary effort is normal.     Breath sounds: Normal breath sounds.  Abdominal:     General: Bowel sounds are normal.  Musculoskeletal:        General: Normal range of motion.  Skin:    General: Skin is warm and dry.  Neurological:     General: No focal deficit present.     Mental Status: She is alert and oriented to person, place, and time. Mental status is at baseline.  Psychiatric:        Mood and Affect: Mood normal.        Behavior: Behavior normal.        Thought Content: Thought content normal.        Judgment: Judgment normal.           01/09/2023   10:38 AM  Depression screen PHQ 2/9  Decreased Interest 0  Down, Depressed, Hopeless 0  PHQ - 2 Score 0  Altered sleeping 0  Tired, decreased energy 0  Change in appetite 0  Feeling bad or failure about yourself  0  Trouble concentrating 0  Moving slowly or fidgety/restless 0  Suicidal thoughts 0  PHQ-9 Score 0  Difficult doing work/chores Not difficult at all      01/09/2023   10:38 AM  GAD 7 : Generalized Anxiety Score  Nervous, Anxious, on Edge 0  Control/stop worrying 0  Worry too much - different things 0  Trouble relaxing 0  Restless 0  Easily annoyed or irritable 0  Afraid - awful might happen 0  Total GAD 7 Score 0  Anxiety Difficulty Not difficult at all    ASSESSMENT/PLAN:  Primary hypertension Assessment & Plan: Well-controlled on low-dose hydrochlorothiazide . Occasional lightheadedness suggests possible hypotension. Blood pressure low, medication may not be needed daily. - Discontinue hydrochlorothiazide  for a few days, monitor blood pressure at home. - Restart hydrochlorothiazide  if blood pressure increases. -  Consider dose reduction if dizziness persists.  Orders: -     hydroCHLOROthiazide ; Take 1 tablet (12.5 mg total) by mouth daily.  Dispense: 90 tablet; Refill: 3 -     Comprehensive metabolic panel with GFR  Vitamin D deficiency -     VITAMIN D 25 Hydroxy (Vit-D Deficiency, Fractures)  Hyperlipidemia, unspecified hyperlipidemia type -     Lipid panel  Gastroesophageal reflux disease without esophagitis Assessment & Plan: Managed with Prilosec. Severe acid reflux with allergy symptoms. - Refill Prilosec.  Orders: -     Omeprazole ; Take 1 capsule (20 mg total) by mouth daily.  Dispense: 90 capsule; Refill: 3  Vitamin B 12 deficiency -     Vitamin B12  Abnormal glucose -     Hemoglobin A1c  Allergic rhinitis, unspecified seasonality, unspecified trigger Assessment & Plan: Exacerbated by severe allergy season. Uses moisture drops for relief. - Continue moisture drops.     PDMP reviewed  Return in about 2 weeks (around 07/30/2023) for PCP, HTN.  Valli Gaw, MD

## 2023-07-16 NOTE — Patient Instructions (Addendum)
 It was a pleasure meeting you today. Thank you for allowing me to take part in your health care.  Our goals for today as we discussed include:  We will get some labs today.  If they are abnormal or we need to do something about them, I will call you.  If they are normal, I will send you a message on MyChart (if it is active) or a letter in the mail.  If you don't hear from us  in 2 weeks, please call the office at the number below.   Blood pressure is normal Can stop Hydrochlorothiazide .  Continue to monitor blood pressure.  If blood pressure rises >130/90 restart medication.    Follow up in 2 weeks  Refills sent for requested medications.   This is a list of the screening recommended for you and due dates:  Health Maintenance  Topic Date Due   Medicare Annual Wellness Visit  Never done   COVID-19 Vaccine (1) Never done   Hepatitis C Screening  Never done   DTaP/Tdap/Td vaccine (1 - Tdap) Never done   Zoster (Shingles) Vaccine (1 of 2) 01/07/1965   Flu Shot  09/28/2023   Pneumonia Vaccine  Completed   DEXA scan (bone density measurement)  Completed   HPV Vaccine  Aged Out   Meningitis B Vaccine  Aged Out      If you have any questions or concerns, please do not hesitate to call the office at [phone].  I look forward to our next visit and until then take care and stay safe.  Regards,   Valli Gaw, MD   [facility]

## 2023-07-17 LAB — VITAMIN D 25 HYDROXY (VIT D DEFICIENCY, FRACTURES): VITD: 31.34 ng/mL (ref 30.00–100.00)

## 2023-07-17 LAB — VITAMIN B12: Vitamin B-12: 183 pg/mL — ABNORMAL LOW (ref 211–911)

## 2023-07-22 ENCOUNTER — Ambulatory Visit: Payer: Self-pay | Admitting: Family Medicine

## 2023-07-22 ENCOUNTER — Encounter: Payer: Self-pay | Admitting: Family Medicine

## 2023-07-22 DIAGNOSIS — E538 Deficiency of other specified B group vitamins: Secondary | ICD-10-CM | POA: Insufficient documentation

## 2023-07-22 DIAGNOSIS — H269 Unspecified cataract: Secondary | ICD-10-CM | POA: Insufficient documentation

## 2023-07-22 DIAGNOSIS — R7309 Other abnormal glucose: Secondary | ICD-10-CM | POA: Insufficient documentation

## 2023-07-22 MED ORDER — VITAMIN B-12 1000 MCG PO TABS: 1000.0000 ug | ORAL_TABLET | Freq: Every day | ORAL | 3 refills | Status: AC

## 2023-07-22 NOTE — Assessment & Plan Note (Signed)
 Well-controlled on low-dose hydrochlorothiazide . Occasional lightheadedness suggests possible hypotension. Blood pressure low, medication may not be needed daily. - Discontinue hydrochlorothiazide  for a few days, monitor blood pressure at home. - Restart hydrochlorothiazide  if blood pressure increases. - Consider dose reduction if dizziness persists.

## 2023-07-22 NOTE — Assessment & Plan Note (Signed)
 Managed with Prilosec. Severe acid reflux with allergy symptoms. - Refill Prilosec.

## 2023-07-22 NOTE — Assessment & Plan Note (Signed)
 Exacerbated by severe allergy season. Uses moisture drops for relief. - Continue moisture drops.

## 2023-07-22 NOTE — Assessment & Plan Note (Signed)
 Chronic vision changes, especially left eye. Recently seen ophthalmology Scheduled for surgery with Dr. Joelle Musca in January or February.

## 2023-07-30 ENCOUNTER — Ambulatory Visit (INDEPENDENT_AMBULATORY_CARE_PROVIDER_SITE_OTHER): Admitting: Family Medicine

## 2023-07-30 ENCOUNTER — Encounter: Payer: Self-pay | Admitting: Family Medicine

## 2023-07-30 VITALS — BP 122/64 | HR 84 | Temp 98.2°F | Resp 20 | Ht 60.0 in | Wt 144.0 lb

## 2023-07-30 DIAGNOSIS — I10 Essential (primary) hypertension: Secondary | ICD-10-CM | POA: Diagnosis not present

## 2023-07-30 DIAGNOSIS — E785 Hyperlipidemia, unspecified: Secondary | ICD-10-CM

## 2023-07-30 NOTE — Progress Notes (Unsigned)
 SUBJECTIVE:   Chief Complaint  Patient presents with   Hypertension    2 weeks follow up   HPI Presents for follow up hypertension  Discussed the use of AI scribe software for clinical note transcription with the patient, who gave verbal consent to proceed.  History of Present Illness Yvette Baker is a 78 year old female with hypertension who presents for a follow-up on her blood pressure management.  She has not taken her prescribed hydrochlorothiazide  for the past two weeks and has been taking over-the-counter vitamin D3, vitamin E, and B12 daily with breakfast. Her home blood pressure readings have been consistently around 120/60 mmHg. She reports feeling better since stopping the medication and previously experienced leg cramps while taking hydrochlorothiazide .  She has a history of high cholesterol and has experienced muscle cramps with previous cholesterol medications, including Zetia. She believes she can manage her cholesterol with diet and exercise. Her cholesterol was reported as high in the past, but recent levels were not specified.  Her social history includes cooking with Himalayan salt and occasionally consuming processed meats like bacon and chicken. She does not eat out frequently and tries to monitor her sodium intake. She remains active and continues to walk regularly.  No dizziness, headaches, chest pain, shortness of breath, heart palpitations, or leg swelling. She confirms that she has never had a heart attack or stroke.     PERTINENT PMH / PSH: As above  OBJECTIVE:  BP 122/64   Pulse 84   Temp 98.2 F (36.8 C)   Resp 20   Ht 5' (1.524 m)   Wt 144 lb (65.3 kg)   SpO2 98%   BMI 28.12 kg/m    Physical Exam Vitals reviewed.  Constitutional:      General: She is not in acute distress.    Appearance: Normal appearance. She is not ill-appearing, toxic-appearing or diaphoretic.  Eyes:     General:        Right eye: No discharge.        Left eye:  No discharge.     Conjunctiva/sclera: Conjunctivae normal.  Cardiovascular:     Rate and Rhythm: Normal rate and regular rhythm.     Heart sounds: Normal heart sounds.  Pulmonary:     Effort: Pulmonary effort is normal.     Breath sounds: Normal breath sounds.  Musculoskeletal:        General: Normal range of motion.     Right lower leg: No edema.     Left lower leg: No edema.  Skin:    General: Skin is warm and dry.  Neurological:     General: No focal deficit present.     Mental Status: She is alert and oriented to person, place, and time. Mental status is at baseline.  Psychiatric:        Mood and Affect: Mood normal.        Behavior: Behavior normal.        Thought Content: Thought content normal.        Judgment: Judgment normal.           01/09/2023   10:38 AM  Depression screen PHQ 2/9  Decreased Interest 0  Down, Depressed, Hopeless 0  PHQ - 2 Score 0  Altered sleeping 0  Tired, decreased energy 0  Change in appetite 0  Feeling bad or failure about yourself  0  Trouble concentrating 0  Moving slowly or fidgety/restless 0  Suicidal thoughts 0  PHQ-9  Score 0  Difficult doing work/chores Not difficult at all      01/09/2023   10:38 AM  GAD 7 : Generalized Anxiety Score  Nervous, Anxious, on Edge 0  Control/stop worrying 0  Worry too much - different things 0  Trouble relaxing 0  Restless 0  Easily annoyed or irritable 0  Afraid - awful might happen 0  Total GAD 7 Score 0  Anxiety Difficulty Not difficult at all    ASSESSMENT/PLAN:  Primary hypertension Assessment & Plan: Hypertension well-controlled with home readings 120/60 mmHg. Off hydrochlorothiazide  due to leg cramps and electrolyte concerns. No symptoms reported. Risk of increased BP leading to MI or CVA discussed. Low sodium diet recommended. - Monitor BP twice weekly. - Follow up in three months to reassess BP control. - Restart hydrochlorothiazide  if BP increases or symptoms like  headaches occur. - Consider alternative antihypertensive if hydrochlorothiazide  causes side effects. - Advised low sodium diet and avoiding processed foods.   Hyperlipidemia, unspecified hyperlipidemia type Assessment & Plan: Hyperlipidemia with intolerance to Zetia due to muscle cramps. Prefers dietary and exercise management. - Encourage dietary modifications and exercise.    PDMP reviewed  Return in about 3 months (around 10/30/2023) for PCP.  Valli Gaw, MD

## 2023-07-30 NOTE — Patient Instructions (Signed)
 It was a pleasure meeting you today. Thank you for allowing me to take part in your health care.  Our goals for today as we discussed include:  Discontinue the Hydrochlorothiazide  Continue to monitor your blood pressure.  Goal <150/90.   If blood pressure starts to increase please notify MD  If any headaches, shortness of breath, chest pain, please notify MD  Avoid processed foods and limit salt/sodium intake  Follow up in 3 months  This is a list of the screening recommended for you and due dates:  Health Maintenance  Topic Date Due   Medicare Annual Wellness Visit  Never done   COVID-19 Vaccine (1) Never done   Hepatitis C Screening  Never done   DTaP/Tdap/Td vaccine (1 - Tdap) Never done   Zoster (Shingles) Vaccine (1 of 2) 01/07/1964   Flu Shot  09/28/2023   Pneumonia Vaccine  Completed   DEXA scan (bone density measurement)  Completed   HPV Vaccine  Aged Out   Meningitis B Vaccine  Aged Out      If you have any questions or concerns, please do not hesitate to call the office at [phone].  I look forward to our next visit and until then take care and stay safe.  Regards,   Valli Gaw, MD   [facility]

## 2023-07-31 IMAGING — MG MM DIGITAL SCREENING BILAT W/ TOMO AND CAD
8 series · 8 of 24 positions shown · non-contrast
Comparison: Previous exam(s).

CLINICAL DATA: Screening.

EXAM:
DIGITAL SCREENING BILATERAL MAMMOGRAM WITH TOMOSYNTHESIS AND CAD
TECHNIQUE: Bilateral screening digital craniocaudal and mediolateral oblique
mammograms were obtained. Bilateral screening digital breast
tomosynthesis was performed. The images were evaluated with
computer-aided detection.

[L MLO synth-2D]
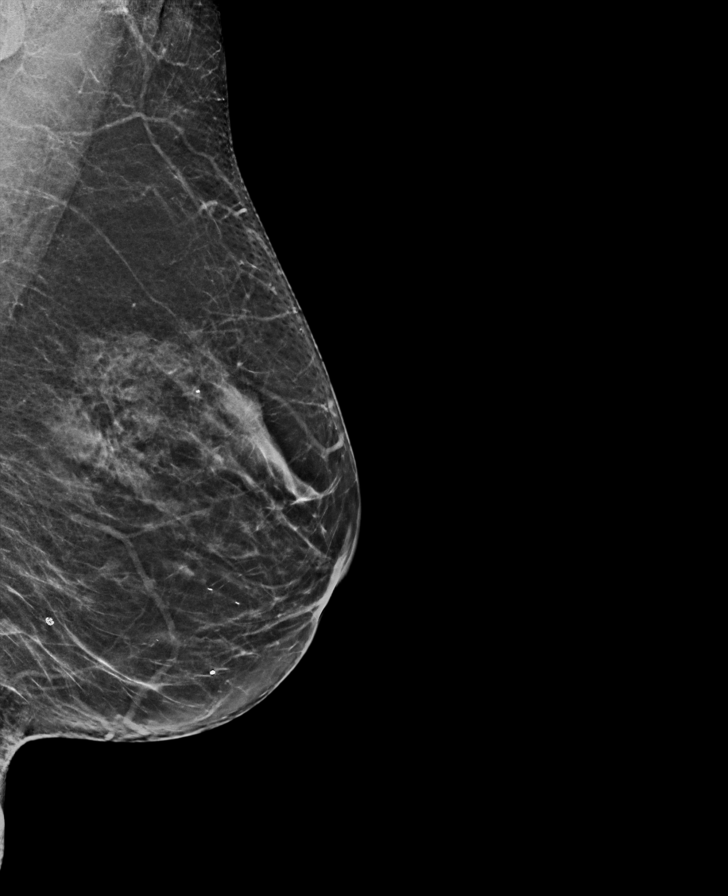

[R MLO synth-2D]
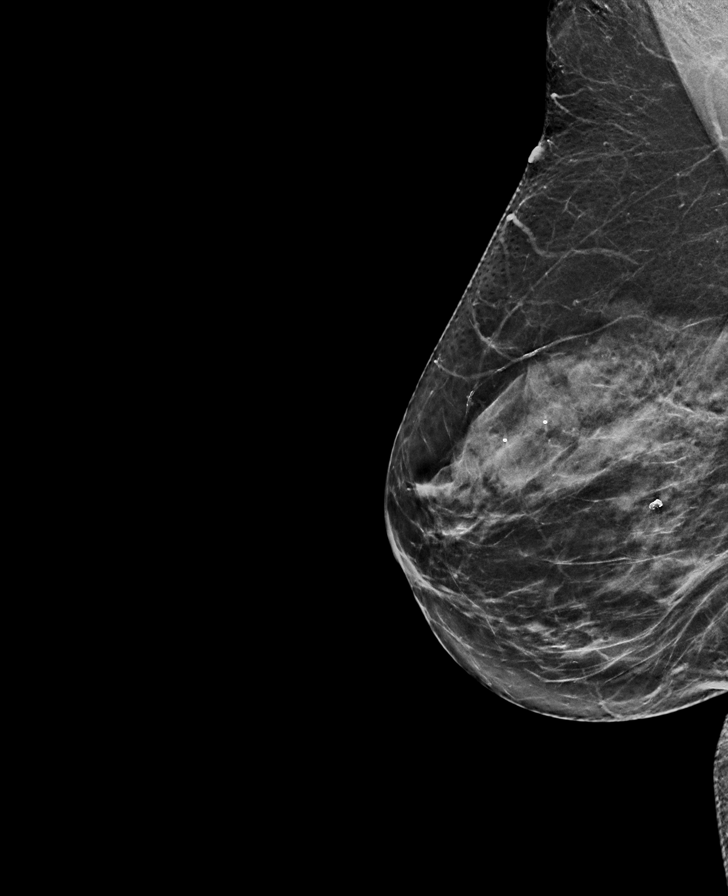

[L CC synth-2D]
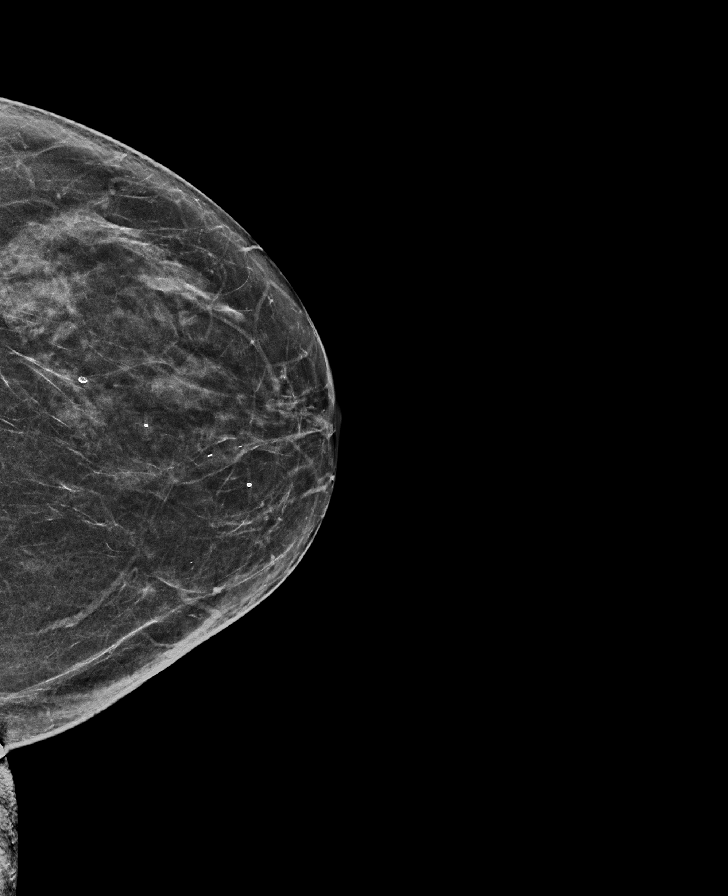

[R CC synth-2D]
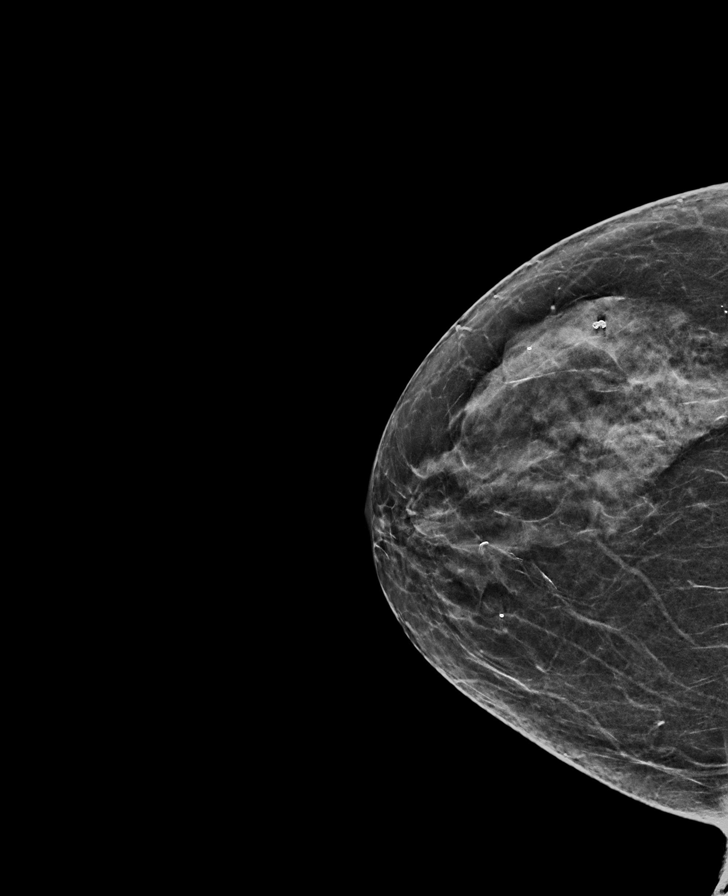

[R CC tomo · tomo slice 31/62.0]
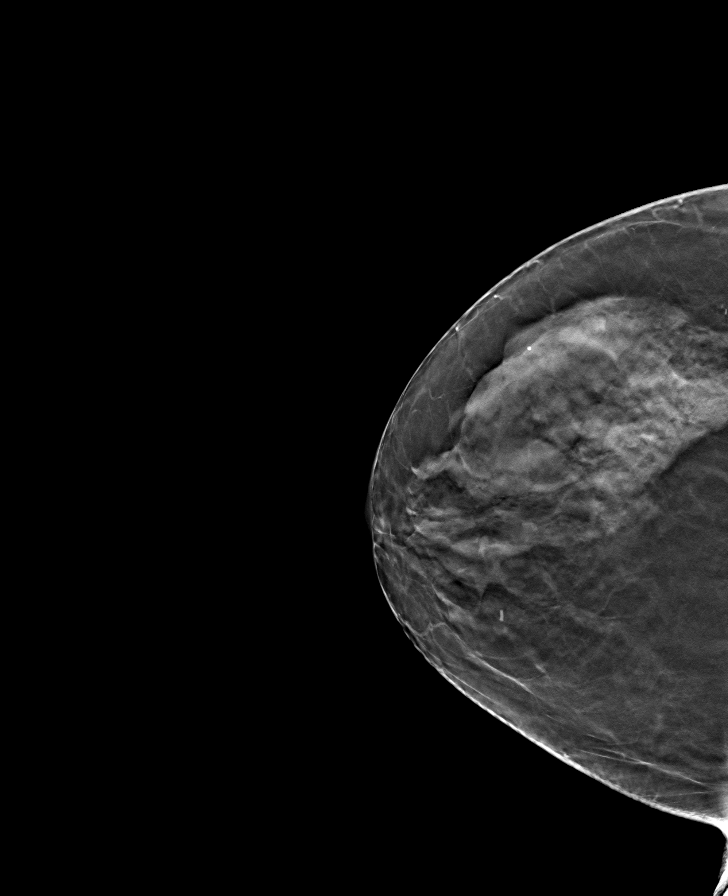

[L CC tomo · tomo slice 32/63.0]
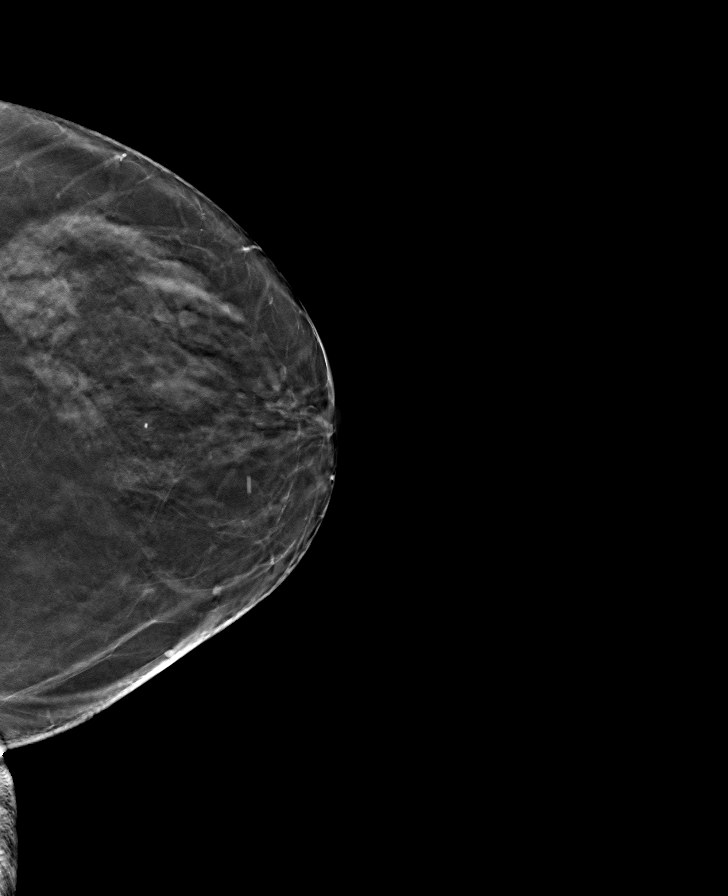

[L MLO tomo · tomo slice 31/61.0]
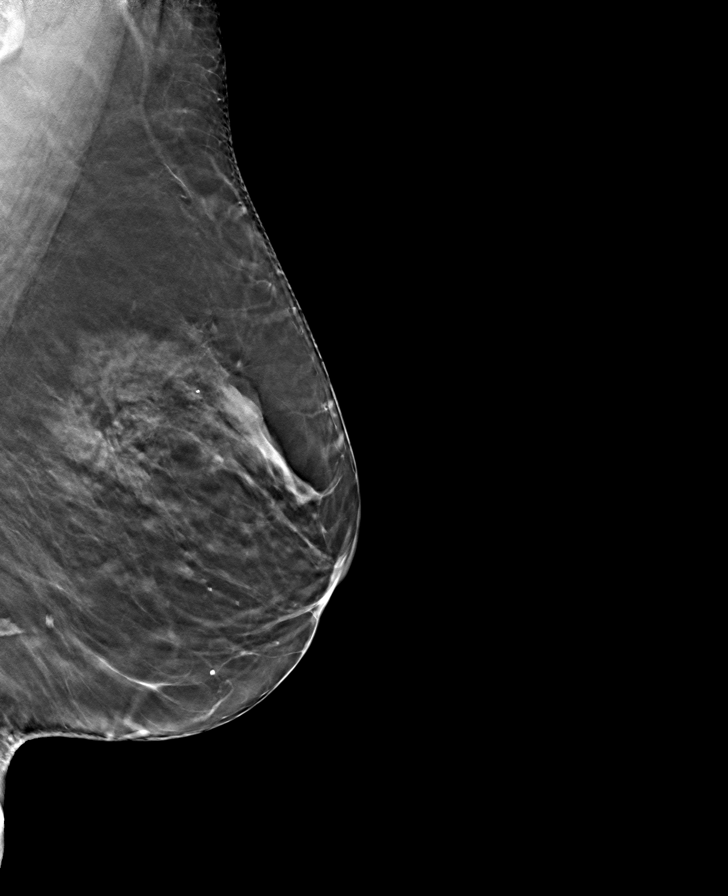

[R MLO tomo · tomo slice 31/62.0]
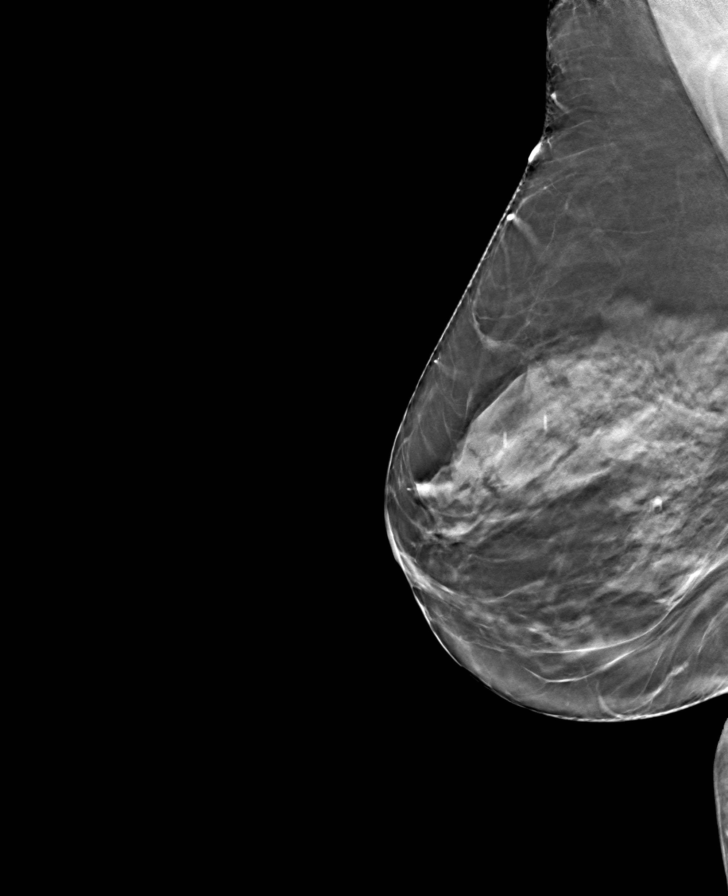

[8 of 24 positions shown; findings below may reference images not displayed]

ACR Breast Density Category c: The breast tissue is heterogeneously
dense, which may obscure small masses.
FINDINGS: There are no findings suspicious for malignancy.
IMPRESSION: No mammographic evidence of malignancy. A result letter of this
screening mammogram will be mailed directly to the patient.

RECOMMENDATION:
Screening mammogram in one year. (Code:Q3-W-BC3)

BI-RADS CATEGORY  1: Negative.

## 2023-08-02 ENCOUNTER — Encounter: Payer: Self-pay | Admitting: Family Medicine

## 2023-08-02 NOTE — Assessment & Plan Note (Signed)
 Hyperlipidemia with intolerance to Zetia due to muscle cramps. Prefers dietary and exercise management. - Encourage dietary modifications and exercise.

## 2023-08-02 NOTE — Assessment & Plan Note (Signed)
 Hypertension well-controlled with home readings 120/60 mmHg. Off hydrochlorothiazide  due to leg cramps and electrolyte concerns. No symptoms reported. Risk of increased BP leading to MI or CVA discussed. Low sodium diet recommended. - Monitor BP twice weekly. - Follow up in three months to reassess BP control. - Restart hydrochlorothiazide  if BP increases or symptoms like headaches occur. - Consider alternative antihypertensive if hydrochlorothiazide  causes side effects. - Advised low sodium diet and avoiding processed foods.

## 2023-08-21 ENCOUNTER — Ambulatory Visit: Payer: Medicare Other | Admitting: Dermatology

## 2023-08-27 ENCOUNTER — Ambulatory Visit (INDEPENDENT_AMBULATORY_CARE_PROVIDER_SITE_OTHER): Admitting: Nurse Practitioner

## 2023-08-27 VITALS — BP 128/60 | HR 83 | Temp 97.8°F | Ht 60.0 in | Wt 143.0 lb

## 2023-08-27 DIAGNOSIS — B029 Zoster without complications: Secondary | ICD-10-CM | POA: Insufficient documentation

## 2023-08-27 MED ORDER — VALACYCLOVIR HCL 1 G PO TABS: 1000.0000 mg | ORAL_TABLET | Freq: Three times a day (TID) | ORAL | 0 refills | Status: DC

## 2023-08-27 NOTE — Progress Notes (Signed)
   Acute Office Visit  Subjective:     Patient ID: Yvette Baker, female    DOB: October 19, 1945, 78 y.o.   MRN: [id number]  Chief Complaint  Patient presents with   Herpes Zoster    Pt complains of rash on R shoulder and arm, neck, and Right side of face. Pt state of burning, itching, and redness. Started middle of last week. Pt used creams and benadryl but didn't help much.     HPI Patient is in today for rash with a history of HT, GERD, allergies, and vitamin D  def  Symptom started Wednesday of last week. States that she thought it was a bug bite as she does help with the yard work with her husband. States that she mowed that Monday prior. States that she noticed it. States that it is burning, stinging, tender and some itching at first.  States that she used an itch relief cream that did help and she used a benadryl pill. State that it did help make her sleepy. State that she did try a gabapentin    Review of Systems  Constitutional:  Negative for chills and fever.  Respiratory:  Negative for shortness of breath.   Cardiovascular:  Negative for chest pain.  Skin:  Positive for itching and rash.        Objective:    BP 128/60   Pulse 83   Temp 97.8 F (36.6 C) (Oral)   Ht 5' (1.524 m)   Wt 143 lb (64.9 kg)   SpO2 94%   BMI 27.93 kg/m    Physical Exam Vitals and nursing note reviewed.  Constitutional:      Appearance: Normal appearance.   Cardiovascular:     Rate and Rhythm: Normal rate and regular rhythm.     Heart sounds: Normal heart sounds.  Pulmonary:     Effort: Pulmonary effort is normal.     Breath sounds: Normal breath sounds.   Skin:      Neurological:     Mental Status: She is alert.     No results found for any visits on 08/27/23.      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  No face involvement.  Did review  signs and symptoms and when to seek eye care from her eye care professional.  Will start valacyclovir 1000 mg 3 times daily for 7 days.  Patient to use gabapentin 100 mg nightly as needed.  Patient not to use Benadryl and gabapentin at the same time.      Relevant Medications   valACYclovir (VALTREX) 1000 MG tablet    Meds ordered this encounter  Medications   valACYclovir (VALTREX) 1000 MG tablet    Sig: Take 1 tablet (1,000 mg total) by mouth 3 (three) times daily.    Dispense:  21 tablet    Refill:  0    Supervising Provider:   RANDEEN HARDY A [[id number]]    Return if symptoms worsen or fail to improve.  Adina Crandall, NP

## 2023-08-27 NOTE — Assessment & Plan Note (Signed)
 Starts at proximal jawline.  No face involvement.  Did review signs and symptoms and when to seek eye care from her eye care professional.  Will start valacyclovir 1000 mg 3 times daily for 7 days.  Patient to use gabapentin 100 mg nightly as needed.  Patient not to use Benadryl and gabapentin at the same time.

## 2023-08-27 NOTE — Patient Instructions (Signed)
 If you take gabapentin take 1 before bed as it may make you sleepy. That is if it is 100mg  capsule DO NOT take benadryl and gabapentin together Follow up if you do not improve

## 2023-09-25 ENCOUNTER — Encounter: Payer: Self-pay | Admitting: Primary Care

## 2023-09-25 ENCOUNTER — Ambulatory Visit (INDEPENDENT_AMBULATORY_CARE_PROVIDER_SITE_OTHER): Admitting: Primary Care

## 2023-09-25 VITALS — BP 142/68 | HR 71 | Temp 97.2°F | Ht 60.0 in | Wt 145.0 lb

## 2023-09-25 DIAGNOSIS — L237 Allergic contact dermatitis due to plants, except food: Secondary | ICD-10-CM | POA: Diagnosis not present

## 2023-09-25 MED ORDER — PREDNISONE 20 MG PO TABS: ORAL_TABLET | ORAL | 0 refills | Status: DC

## 2023-09-25 NOTE — Assessment & Plan Note (Signed)
 Exam today representative.  Given numerous locations of rash, will treat with systemic treatment.  She agrees.  Start prednisone  tablets. Take two tablets my mouth once daily in the morning for four days, then one tablet once daily in the morning for four days.  We discussed topical cortisone cream as needed.  She will update if no improvement.

## 2023-09-25 NOTE — Progress Notes (Signed)
 Subjective:    Patient ID: Yvette Baker, female    DOB: 1945-08-09, 78 y.o.   MRN: [id number]  Rash Pertinent negatives include no fever or shortness of breath.    Yvette Baker is a very pleasant 78 y.o. female patient of Matt, NP with a history of hypertension, GERD, herpes zoster, osteoporosis, hyperlipidemia who presents today to discuss rash.  Symptom onset 2 evenings ago with a rash to the left anterior wrist (anatomical position). The rash began blistering. Yesterday she began noticing the same type of rash to the right lateral wrist. This morning she noticed a new spot to her right mid humeral region.   Prior to symptom onset she was picking a few weeds. She's been taking Benadryl and her usual Zyrtec without much improvement. She's applied an OTC anti-itch cream with some improvement.    Review of Systems  Constitutional:  Negative for fever.  Respiratory:  Negative for shortness of breath.   Cardiovascular:  Negative for chest pain.  Skin:  Positive for rash.         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 - treated with ED&C - will recheck at next follow up   Squamous cell carcinoma of skin     Social History   Socioeconomic History   Marital status: Married    Spouse name: Not on file   Number of children: Not on file   Years of education: Not on file   Highest education level: Not on file  Occupational History   Not on file  Tobacco Use   Smoking status: Never   Smokeless tobacco: Never  Vaping Use   Vaping status: Never Used  Substance and Sexual Activity   Alcohol use: Never   Drug use: Never   Sexual activity: Not Currently  Other Topics Concern   Not on file  Social History Narrative   Not on file   Social Drivers of Health   Financial Resource Strain: Low Risk  (11/27/2022)   Received from [facility]   Overall Financial Resource  Strain (CARDIA)    Difficulty of Paying Living Expenses: Not hard at all  Food Insecurity: No Food Insecurity (11/27/2022)   Received from [facility]   Hunger Vital Sign    Within the past 12 months, you worried that your food would run out before you got the money to buy more.: Never true    Within the past 12 months, the food you bought just didn't last and you didn't have money to get more.: Never true  Transportation Needs: No Transportation Needs (11/27/2022)   Received from [facility] - Transportation    In the past 12 months, has lack of transportation kept you from medical appointments or from getting medications?: No    Lack of Transportation (Non-Medical): No  Physical Activity: Not on file  Stress: Not on file  Social Connections: Not on file  Intimate Partner Violence: Not on file    History reviewed. No pertinent surgical history.  Family History  Problem Relation Age of Onset   Stroke Mother    Cancer Father    Breast cancer Neg Hx     Allergies  Allergen Reactions   Penicillins Other (See Comments)    Bladder irritation.  Onset 03/10/1999.   Sulfa Antibiotics Other (See Comments)    Onset  03/10/1999.    Current Outpatient Medications on File Prior to Visit  Medication Sig Dispense Refill   cyanocobalamin  (VITAMIN B12) 1000 MCG tablet Take 1 tablet (1,000 mcg total) by mouth daily. 90 tablet 3   fluticasone (FLONASE) 50 MCG/ACT nasal spray Place 2 sprays into both nostrils as needed.     Loratadine 10 MG CAPS Take by mouth.     Vitamin D , Ergocalciferol , (DRISDOL) 1.25 MG (50000 UNIT) CAPS capsule Take 50,000 Units by mouth every 14 (fourteen) days.     hydrochlorothiazide  (HYDRODIURIL ) 12.5 MG tablet Take 1 tablet (12.5 mg total) by mouth daily. (Patient not taking: Reported on 09/25/2023) 90 tablet 3   omeprazole  (PRILOSEC) 20 MG capsule Take 1 capsule (20 mg total) by mouth daily. (Patient not taking: Reported on  09/25/2023) 90 capsule 3   valACYclovir  (VALTREX ) 1000 MG tablet Take 1 tablet (1,000 mg total) by mouth 3 (three) times daily. (Patient not taking: Reported on 09/25/2023) 21 tablet 0   No current facility-administered medications on file prior to visit.    BP (!) 142/68   Pulse 71   Temp (!) 97.2 F (36.2 C) (Temporal)   Ht 5' (1.524 m)   Wt 145 lb (65.8 kg)   SpO2 96%   BMI 28.32 kg/m  Objective:   Physical Exam Constitutional:      General: She is not in acute distress. Cardiovascular:     Rate and Rhythm: Normal rate.  Pulmonary:     Effort: Pulmonary effort is normal.  Skin:    General: Skin is warm and dry.     Findings: Rash present.     Comments: Vesicle type rash to left anterior wrist (anatomical position), right lateral wrist.  Small nonvesicle raised spot to right humeral region.  Neurological:     Mental Status: She is alert.           Assessment & Plan:  Poison ivy dermatitis Assessment & Plan: Exam today representative.  Given numerous locations of rash, will treat with systemic treatment.  She agrees.  Start prednisone  tablets. Take two tablets my mouth once daily in the morning for four days, then one tablet once daily in the morning for four days.  We discussed topical cortisone cream as needed.  She will update if no improvement.   Orders: -     predniSONE ; Take two tablets my mouth once daily in the morning for four days, then one tablet once daily in the morning for four days.  Dispense: 12 tablet; Refill: 0        Comer MARLA Gaskins, NP

## 2023-09-25 NOTE — Patient Instructions (Signed)
Start prednisone tablets. Take two tablets my mouth once daily in the morning for four days, then one tablet once daily in the morning for four days.   It was a pleasure meeting you!

## 2023-11-05 ENCOUNTER — Ambulatory Visit (INDEPENDENT_AMBULATORY_CARE_PROVIDER_SITE_OTHER): Admitting: Family

## 2023-11-05 ENCOUNTER — Encounter: Payer: Self-pay | Admitting: Family

## 2023-11-05 VITALS — BP 120/60 | HR 75 | Temp 98.3°F | Resp 20 | Ht 60.0 in | Wt 141.5 lb

## 2023-11-05 DIAGNOSIS — K219 Gastro-esophageal reflux disease without esophagitis: Secondary | ICD-10-CM

## 2023-11-05 DIAGNOSIS — I10 Essential (primary) hypertension: Secondary | ICD-10-CM

## 2023-11-05 MED ORDER — OMEPRAZOLE 20 MG PO CPDR: 20.0000 mg | DELAYED_RELEASE_CAPSULE | Freq: Every day | ORAL | Status: AC | PRN

## 2023-11-05 NOTE — Progress Notes (Unsigned)
   Assessment & Plan:  There are no diagnoses linked to this encounter.   Return precautions given.   Risks, benefits, and alternatives of the medications and treatment plan prescribed today were discussed, and patient expressed understanding.   Education regarding symptom management and diagnosis given to patient on AVS either electronically or printed.  No follow-ups on file.  Rollene Northern, FNP  Subjective:    Patient ID: Yvette Baker, female    DOB: 1945-12-03, 78 y.o.   MRN: [id number]  CC: Yvette Baker is a 78 y.o. female who presents today for an acute visit.    HPI: HPI  Dr Hope 07/30/23  Leg cramps on hydrochlorothiazide   No h/o MI, CVA  Crt 0.90 07/07/23  She politely declines labs today.   Allergies: Penicillins and Sulfa antibiotics Current Outpatient Medications on File Prior to Visit  Medication Sig Dispense Refill   cholecalciferol (VITAMIN D3) 25 MCG (1000 UNIT) tablet Take 1,000 Units by mouth daily.     cyanocobalamin  (VITAMIN B12) 1000 MCG tablet Take 1 tablet (1,000 mcg total) by mouth daily. 90 tablet 3   fexofenadine-pseudoephedrine (ALLEGRA-D 24) 180-240 MG 24 hr tablet Take 1 tablet by mouth daily.     fluticasone (FLONASE) 50 MCG/ACT nasal spray Place 2 sprays into both nostrils as needed.     magnesium (MAGTAB) 84 MG ( ) TBCR SR tablet Take 84 mg by mouth daily.     Vitamin D , Ergocalciferol , (DRISDOL) 1.25 MG (50000 UNIT) CAPS capsule Take 50,000 Units by mouth every 14 (fourteen) days.     hydrochlorothiazide  (HYDRODIURIL ) 12.5 MG tablet Take 1 tablet (12.5 mg total) by mouth daily. (Patient not taking: Reported on 11/05/2023) 90 tablet 3   omeprazole  (PRILOSEC) 20 MG capsule Take 1 capsule (20 mg total) by mouth daily. (Patient not taking: Reported on 11/05/2023) 90 capsule 3   predniSONE  (DELTASONE ) 20 MG tablet Take two tablets my mouth once daily in the morning for four days, then one tablet once daily in the morning for four days. 12  tablet 0   valACYclovir  (VALTREX ) 1000 MG tablet Take 1 tablet (1,000 mg total) by mouth 3 (three) times daily. (Patient not taking: Reported on 09/25/2023) 21 tablet 0   No current facility-administered medications on file prior to visit.    Review of Systems    Objective:    BP 120/60   Pulse 75   Temp 98.3 F (36.8 C)   Resp 20   Ht 5' (1.524 m)   Wt 141 lb 8 oz (64.2 kg)   SpO2 98%   BMI 27.63 kg/m   BP Readings from Last 3 Encounters:  11/05/23 120/60  09/25/23 (!) 142/68  08/27/23 128/60   Wt Readings from Last 3 Encounters:  11/05/23 141 lb 8 oz (64.2 kg)  09/25/23 145 lb (65.8 kg)  08/27/23 143 lb (64.9 kg)    Physical Exam

## 2023-11-05 NOTE — Patient Instructions (Addendum)
 Please call  and schedule your 3D mammogram and /or bone density scan as we discussed.   [facility]  ( new location in 2023)  [address], [place], [place] [address]  [place], [place]  [phone]   Nice to meet you!

## 2023-11-08 NOTE — Assessment & Plan Note (Signed)
 Essential hypertension  Blood pressure is well-controlled without medication, with home readings around 120/60 mmHg. Occasional dizziness has resolved. Discontinue hydrochlorothiazide  from the chart. Advise home blood pressure monitoring, especially if symptoms arise.

## 2023-11-19 ENCOUNTER — Other Ambulatory Visit: Payer: Self-pay | Admitting: Family

## 2023-11-19 DIAGNOSIS — Z1231 Encounter for screening mammogram for malignant neoplasm of breast: Secondary | ICD-10-CM

## 2023-12-10 ENCOUNTER — Ambulatory Visit
Admission: RE | Admit: 2023-12-10 | Discharge: 2023-12-10 | Disposition: A | Discharge status: Home / Self Care | Source: Ambulatory Visit | Attending: Family | Admitting: Family

## 2023-12-10 DIAGNOSIS — Z1231 Encounter for screening mammogram for malignant neoplasm of breast: Secondary | ICD-10-CM | POA: Insufficient documentation

## 2024-04-07 ENCOUNTER — Encounter
